# Patient Record
Sex: Male | Born: 1985 | Race: Black or African American | Hispanic: No | Marital: Married | Smoking: Never smoker
Health system: Southern US, Community
[De-identification: ages and names within clinical notes are randomized; demographics above are authoritative.]

## PROBLEM LIST (undated history)

## (undated) DIAGNOSIS — I10 Essential (primary) hypertension: Secondary | ICD-10-CM

## (undated) DIAGNOSIS — R569 Unspecified convulsions: Secondary | ICD-10-CM

## (undated) HISTORY — PX: NO PAST SURGERIES: SHX2092

---

## 2019-11-22 DIAGNOSIS — I480 Paroxysmal atrial fibrillation: Secondary | ICD-10-CM

## 2019-11-22 HISTORY — DX: Paroxysmal atrial fibrillation: I48.0

## 2021-06-27 ENCOUNTER — Other Ambulatory Visit: Payer: Self-pay

## 2021-06-27 ENCOUNTER — Ambulatory Visit (INDEPENDENT_AMBULATORY_CARE_PROVIDER_SITE_OTHER): Payer: BLUE CROSS/BLUE SHIELD

## 2021-06-27 ENCOUNTER — Encounter: Payer: Self-pay | Admitting: Emergency Medicine

## 2021-06-27 ENCOUNTER — Ambulatory Visit
Admission: EM | Admit: 2021-06-27 | Discharge: 2021-06-27 | Disposition: A | Discharge status: Home / Self Care | Payer: Self-pay

## 2021-06-27 DIAGNOSIS — M25571 Pain in right ankle and joints of right foot: Secondary | ICD-10-CM

## 2021-06-27 DIAGNOSIS — S86011A Strain of right Achilles tendon, initial encounter: Secondary | ICD-10-CM

## 2021-06-27 HISTORY — DX: Unspecified convulsions: R56.9

## 2021-06-27 NOTE — ED Triage Notes (Signed)
Patient states that while he was playing basketball yesterday he rolled his right ankle and felt a pop.  Patient c/o pain in his right ankle.

## 2021-06-27 NOTE — Discharge Instructions (Addendum)
Your x-ray is normal but my exam findings are concerning for Achilles tendon tear/rupture.  We have placed you in a splint.  You should wear this as a cast.  Don't get it wet. We have also given you crutches.  Do not bear weight on the affected extremity until orthopedics clears you.  I would suggest you follow-up with orthopedics in the next 1 to 2 days.  You will likely need to have MRI of this extremity and depending on findings you may need physical therapy versus surgery.  In the meantime you can take Tylenol and Advil as needed for pain rest and elevate the extremity.  You have a condition requiring you to follow up with Orthopedics so please call one of the following office for appointment:   [facility] [address] [place], [place] [address] Phone: [phone]  [facility] [address] [place], [place] [address] Phone: [phone]

## 2021-06-27 NOTE — ED Provider Notes (Signed)
[facility]    CSN: [id number] Arrival date & time: 06/27/21  1432      History   Chief Complaint Chief Complaint  Patient presents with   Ankle Pain    right    HPI Brady Phillips is a 35 y.o. male presenting for right ankle pain and swelling following an accident yesterday.  Patient states he was playing basketball and somehow rolled his foot.  He says that he felt a pop and had a sharp immediate pain.  Patient admits to difficulty with weightbearing.  He also has difficulty moving his right foot.  He denies any numbness or tingling.  He has iced the area and taking ibuprofen and Tylenol for discomfort.  Patient denies any pain at rest and only with weightbearing or touching his Achilles tendon.  No other injuries.  HPI  Past Medical History:  Diagnosis Date   Seizures (HCC)     There are no problems to display for this patient.   History reviewed. No pertinent surgical history.     Home Medications    Prior to Admission medications   Medication Sig Start Date End Date Taking? Authorizing Provider  valproic acid (DEPAKENE) 250 MG capsule Take by mouth. 12/21/17  Yes [provider]    Family History History reviewed. No pertinent family history.  Social History Social History   Tobacco Use   Smoking status: Never   Smokeless tobacco: Never  Vaping Use   Vaping Use: Never used  Substance Use Topics   Alcohol use: Yes   Drug use: Never     Allergies   Patient has no known allergies.   Review of Systems Review of Systems  Musculoskeletal:  Positive for arthralgias, gait problem and joint swelling.  Skin:  Negative for color change and wound.  Neurological:  Negative for weakness and numbness.    Physical Exam Triage Vital Signs ED Triage Vitals  Enc Vitals Group     BP 06/27/21 1534 (!) 139/91     Pulse Rate 06/27/21 1534 65     Resp 06/27/21 1534 16     Temp 06/27/21 1534 98.3 F (36.8 C)     Temp Source 06/27/21 1534 Oral      SpO2 06/27/21 1534 100 %     Weight 06/27/21 1530 252 lb (114.3 kg)     Height 06/27/21 1530 5\' 8"  (1.727 m)     Head Circumference --      Peak Flow --      Pain Score 06/27/21 1529 4     Pain Loc --      Pain Edu? --      Excl. in GC? --    No data found.  Updated Vital Signs BP (!) 139/91 (BP Location: Left Arm)   Pulse 65   Temp 98.3 F (36.8 C) (Oral)   Resp 16   Ht 5\' 8"  (1.727 m)   Wt 252 lb (114.3 kg)   SpO2 100%   BMI 38.32 kg/m      Physical Exam Vitals and nursing note reviewed.  Constitutional:      General: He is not in acute distress.    Appearance: Normal appearance. He is well-developed. He is not ill-appearing.  HENT:     Head: Normocephalic and atraumatic.  Eyes:     General: No scleral icterus.    Conjunctiva/sclera: Conjunctivae normal.  Cardiovascular:     Rate and Rhythm: Normal rate and regular rhythm.     Pulses:  Normal pulses.  Pulmonary:     Effort: Pulmonary effort is normal. No respiratory distress.  Abdominal:     Tenderness: There is no abdominal tenderness.  Musculoskeletal:     Cervical back: Neck supple.     Right ankle:     Right Achilles Tendon: Tenderness present. Thompson's test positive.     Comments: Inability to plantarflex right foot. Moderate swelling posterior ankle. No bony tenderness of ankle  Skin:    General: Skin is warm and dry.  Neurological:     General: No focal deficit present.     Mental Status: He is alert. Mental status is at baseline.     Gait: Gait abnormal.  Psychiatric:        Mood and Affect: Mood normal.        Thought Content: Thought content normal.     UC Treatments / Results  Labs (all labs ordered are listed, but only abnormal results are displayed) Labs Reviewed - No data to display  EKG   Radiology DG Ankle Complete Right  Result Date: 06/27/2021 CLINICAL DATA:  Right ankle pain EXAM: RIGHT ANKLE - COMPLETE 3+ VIEW COMPARISON:  None. FINDINGS: There is no evidence of  fracture, dislocation, or joint effusion. There is no evidence of arthropathy or other focal bone abnormality. Soft tissues are unremarkable. IMPRESSION: Negative. Electronically Signed   By: Helyn Numbers MD   On: 06/27/2021 15:50    Procedures Procedures (including critical care time)  Medications Ordered in UC Medications - No data to display  Initial Impression / Assessment and Plan / UC Course  I have reviewed the triage vital signs and the nursing notes.  Pertinent labs & imaging results that were available during my care of the patient were reviewed by me and considered in my medical decision making (see chart for details).  35 year old male presenting for right heel pain following a accidental injury yesterday.  X-ray normal.  On exam he does have positive Thompson test and inability to plantarflex.  Advised patient that his exam is concerning for an Achilles tendon tear/rupture.  Patient placed in short leg posterior splint with downward angulation.  Patient given crutches.  Patient advised to follow-up with orthopedics in the next 1 to 2 days.  He is a Loss adjuster, chartered.  Advised him that he should not be working until he is cleared by orthopedics.  He does not need to be driving since he cannot plantarflex.  Supportive care advised with following RICE guidelines.  ED precautions reviewed.  This is acute complicated injury likely requiring weeks of follow-up including MRI and likely surgery if he does have an Achilles tendon rupture/tear.   Final Clinical Impressions(s) / UC Diagnoses   Final diagnoses:  Achilles tendon rupture, right, initial encounter  Acute right ankle pain     Discharge Instructions      Your x-ray is normal but my exam findings are concerning for Achilles tendon tear/rupture.  We have placed you in a splint.  You should wear this as a cast.  Don't get it wet. We have also given you crutches.  Do not bear weight on the affected extremity until orthopedics  clears you.  I would suggest you follow-up with orthopedics in the next 1 to 2 days.  You will likely need to have MRI of this extremity and depending on findings you may need physical therapy versus surgery.  In the meantime you can take Tylenol and Advil as needed for pain rest and elevate  the extremity.  You have a condition requiring you to follow up with Orthopedics so please call one of the following office for appointment:   [facility] [address] [place], [place] [address] Phone: [phone]  [facility] [address], [place], [place] [address] Phone: [phone]      ED Prescriptions   None    PDMP not reviewed this encounter.   Shirlee Latch, PA-C 06/27/21 [phone]

## 2021-07-05 ENCOUNTER — Encounter
Admission: RE | Admit: 2021-07-05 | Discharge: 2021-07-05 | Disposition: A | Discharge status: Home / Self Care | Payer: BLUE CROSS/BLUE SHIELD | Source: Ambulatory Visit | Attending: Orthopedic Surgery | Admitting: Orthopedic Surgery

## 2021-07-05 ENCOUNTER — Other Ambulatory Visit: Payer: Self-pay

## 2021-07-05 ENCOUNTER — Other Ambulatory Visit: Payer: Self-pay | Admitting: Orthopedic Surgery

## 2021-07-05 HISTORY — DX: Essential (primary) hypertension: I10

## 2021-07-05 MED ORDER — CEFAZOLIN SODIUM-DEXTROSE 2-4 GM/100ML-% IV SOLN
2.0000 g | INTRAVENOUS | Status: AC
Start: 2021-07-06 — End: 2021-07-06
  Administered 2021-07-06: 2 g via INTRAVENOUS

## 2021-07-05 NOTE — Patient Instructions (Signed)
Your procedure is scheduled on: July 06, 2021 TUESDAY Report to the Registration Desk on the 1st floor of the Medical Mall. To find out your arrival time, please call [phone] between 1PM - 3PM on: July 05, 2021 Monday   REMEMBER: Instructions that are not followed completely may result in serious medical risk, up to and including death; or upon the discretion of your surgeon and anesthesiologist your surgery may need to be rescheduled.  DO NOT EAT OR DRINK after midnight the night before surgery.  No gum chewing, lozengers or hard candies.  TAKE THESE MEDICATIONS THE MORNING OF SURGERY WITH A SIP OF WATER: DEPAKENE  One week prior to surgery: Stop Anti-inflammatories (NSAIDS) such as Advil, Aleve, Ibuprofen, Motrin, Naproxen, Naprosyn and ASPIRIN OR Aspirin based products such as Excedrin, Goodys Powder, BC Powder. Stop ANY OVER THE COUNTER supplements until after surgery. You may however, continue to take Tylenol if needed for pain up until the day of surgery.  No Alcohol for 24 hours before or after surgery.  No Smoking including e-cigarettes for 24 hours prior to surgery.  No chewable tobacco products for at least 6 hours prior to surgery.  No nicotine patches on the day of surgery.  Do not use any "recreational" drugs for at least a week prior to your surgery.  Please be advised that the combination of cocaine and anesthesia may have negative outcomes, up to and including death. If you test positive for cocaine, your surgery will be cancelled.  On the morning of surgery brush your teeth with toothpaste and water, you may rinse your mouth with mouthwash if you wish. Do not swallow any toothpaste or mouthwash.  Do not wear jewelry, make-up, hairpins, clips or nail polish.  Do not wear lotions, powders, or perfumes OR DEODORANT   Do not shave body from the neck down 48 hours prior to surgery just in case you cut yourself which could leave a site for infection.  Also,  freshly shaved skin may become irritated if using the CHG soap.  Contact lenses, hearing aids and dentures may not be worn into surgery.  Do not bring valuables to the hospital. [facility] is not responsible for any missing/lost belongings or valuables.   SHOWER MORNING OF SURGERY  Notify your doctor if there is any change in your medical condition (cold, fever, infection).  Wear comfortable clothing (specific to your surgery type) to the hospital.  After surgery, you can help prevent lung complications by doing breathing exercises.  Take deep breaths and cough every 1-2 hours. Your doctor may order a device called an Incentive Spirometer to help you take deep breaths. When coughing or sneezing, hold a pillow firmly against your incision with both hands. This is called "splinting." Doing this helps protect your incision. It also decreases belly discomfort.  If you are being discharged the day of surgery, you will not be allowed to drive home. You will need a responsible adult (18 years or older) to drive you home and stay with you that night.   If you are taking public transportation, you will need to have a responsible adult (18 years or older) with you. Please confirm with your physician that it is acceptable to use public transportation.   Please call the Pre-admissions Testing Dept. at [phone] if you have any questions about these instructions.  Surgery Visitation Policy:  Patients undergoing a surgery or procedure may have one family member or support person with them as long as that  person is not COVID-19 positive or experiencing its symptoms.  That person may remain in the waiting area during the procedure.  Inpatient Visitation:    Visiting hours are 7 a.m. to 8 p.m. Inpatients will be allowed two visitors daily. The visitors may change each day during the patient's stay. No visitors under the age of 78. Any visitor under the age of 19 must be accompanied by an  adult. The visitor must pass COVID-19 screenings, use hand sanitizer when entering and exiting the patient's room and wear a mask at all times, including in the patient's room. Patients must also wear a mask when staff or their visitor are in the room. Masking is required regardless of vaccination status.

## 2021-07-06 ENCOUNTER — Ambulatory Visit: Payer: BLUE CROSS/BLUE SHIELD | Admitting: Certified Registered"

## 2021-07-06 ENCOUNTER — Ambulatory Visit
Admission: RE | Admit: 2021-07-06 | Discharge: 2021-07-06 | Disposition: A | Discharge status: Home / Self Care | Payer: BLUE CROSS/BLUE SHIELD | Attending: Orthopedic Surgery | Admitting: Orthopedic Surgery

## 2021-07-06 ENCOUNTER — Encounter: Payer: Self-pay | Admitting: Orthopedic Surgery

## 2021-07-06 ENCOUNTER — Ambulatory Visit: Payer: BLUE CROSS/BLUE SHIELD

## 2021-07-06 ENCOUNTER — Other Ambulatory Visit: Payer: Self-pay

## 2021-07-06 ENCOUNTER — Encounter
Admission: RE | Disposition: A | Discharge status: Home / Self Care | Payer: Self-pay | Source: Home / Self Care | Attending: Orthopedic Surgery

## 2021-07-06 DIAGNOSIS — X501XXA Overexertion from prolonged static or awkward postures, initial encounter: Secondary | ICD-10-CM | POA: Insufficient documentation

## 2021-07-06 DIAGNOSIS — Z79899 Other long term (current) drug therapy: Secondary | ICD-10-CM | POA: Insufficient documentation

## 2021-07-06 DIAGNOSIS — Z7982 Long term (current) use of aspirin: Secondary | ICD-10-CM | POA: Insufficient documentation

## 2021-07-06 DIAGNOSIS — Z419 Encounter for procedure for purposes other than remedying health state, unspecified: Secondary | ICD-10-CM

## 2021-07-06 DIAGNOSIS — Y9367 Activity, basketball: Secondary | ICD-10-CM | POA: Diagnosis not present

## 2021-07-06 DIAGNOSIS — S86011A Strain of right Achilles tendon, initial encounter: Secondary | ICD-10-CM | POA: Diagnosis not present

## 2021-07-06 HISTORY — PX: ACHILLES TENDON SURGERY: SHX542

## 2021-07-06 SURGERY — REPAIR, TENDON, ACHILLES
Anesthesia: General | Site: Ankle | Laterality: Right

## 2021-07-06 MED ORDER — ONDANSETRON HCL 4 MG/2ML IJ SOLN
INTRAMUSCULAR | Status: AC
  Administered 2021-07-06: 4 mg via INTRAVENOUS
  Filled 2021-07-06: qty 2

## 2021-07-06 MED ORDER — BUPIVACAINE HCL (PF) 0.5 % IJ SOLN
INTRAMUSCULAR | Status: AC
  Filled 2021-07-06: qty 30

## 2021-07-06 MED ORDER — FAMOTIDINE 20 MG PO TABS
ORAL_TABLET | ORAL | Status: AC
  Filled 2021-07-06: qty 1

## 2021-07-06 MED ORDER — DEXMEDETOMIDINE (PRECEDEX) IN NS 20 MCG/5ML (4 MCG/ML) IV SYRINGE
PREFILLED_SYRINGE | INTRAVENOUS | Status: DC | PRN
  Administered 2021-07-06: 20 ug via INTRAVENOUS

## 2021-07-06 MED ORDER — MIDAZOLAM HCL 2 MG/2ML IJ SOLN
INTRAMUSCULAR | Status: AC
  Filled 2021-07-06: qty 2

## 2021-07-06 MED ORDER — ACETAMINOPHEN 10 MG/ML IV SOLN
INTRAVENOUS | Status: DC | PRN
  Administered 2021-07-06: 1000 mg via INTRAVENOUS

## 2021-07-06 MED ORDER — DEXAMETHASONE SODIUM PHOSPHATE 10 MG/ML IJ SOLN
INTRAMUSCULAR | Status: DC | PRN
  Administered 2021-07-06: 4 mg
  Administered 2021-07-06: 10 mg

## 2021-07-06 MED ORDER — LIDOCAINE HCL (CARDIAC) PF 100 MG/5ML IV SOSY
PREFILLED_SYRINGE | INTRAVENOUS | Status: DC | PRN
  Administered 2021-07-06: 50 mg via INTRAVENOUS

## 2021-07-06 MED ORDER — BUPIVACAINE HCL (PF) 0.5 % IJ SOLN
INTRAMUSCULAR | Status: AC
  Filled 2021-07-06: qty 20

## 2021-07-06 MED ORDER — PROPOFOL 10 MG/ML IV BOLUS
INTRAVENOUS | Status: DC | PRN
  Administered 2021-07-06: 200 mg via INTRAVENOUS

## 2021-07-06 MED ORDER — ACETAMINOPHEN 10 MG/ML IV SOLN
INTRAVENOUS | Status: AC
  Filled 2021-07-06: qty 100

## 2021-07-06 MED ORDER — ONDANSETRON HCL 4 MG/2ML IJ SOLN
INTRAMUSCULAR | Status: DC | PRN
  Administered 2021-07-06: 4 mg via INTRAVENOUS

## 2021-07-06 MED ORDER — CHLORHEXIDINE GLUCONATE CLOTH 2 % EX PADS: 6.0000 | MEDICATED_PAD | Freq: Once | CUTANEOUS | Status: DC

## 2021-07-06 MED ORDER — SUGAMMADEX SODIUM 500 MG/5ML IV SOLN
INTRAVENOUS | Status: AC
  Filled 2021-07-06: qty 10

## 2021-07-06 MED ORDER — ROCURONIUM BROMIDE 100 MG/10ML IV SOLN
INTRAVENOUS | Status: DC | PRN
  Administered 2021-07-06: 60 mg via INTRAVENOUS

## 2021-07-06 MED ORDER — FENTANYL CITRATE (PF) 100 MCG/2ML IJ SOLN: 100.0000 ug | Freq: Once | INTRAMUSCULAR | Status: AC

## 2021-07-06 MED ORDER — ASPIRIN EC 325 MG PO TBEC: 325.0000 mg | DELAYED_RELEASE_TABLET | Freq: Two times a day (BID) | ORAL | 0 refills | Status: AC

## 2021-07-06 MED ORDER — KETAMINE HCL 10 MG/ML IJ SOLN
INTRAMUSCULAR | Status: DC | PRN
  Administered 2021-07-06: 20 mg via INTRAVENOUS
  Administered 2021-07-06: 30 mg via INTRAVENOUS

## 2021-07-06 MED ORDER — ONDANSETRON HCL 4 MG PO TABS: 4.0000 mg | ORAL_TABLET | Freq: Three times a day (TID) | ORAL | 0 refills | Status: AC | PRN

## 2021-07-06 MED ORDER — CHLORHEXIDINE GLUCONATE 0.12 % MT SOLN
OROMUCOSAL | Status: AC
  Administered 2021-07-06: 15 mL via OROMUCOSAL
  Filled 2021-07-06: qty 15

## 2021-07-06 MED ORDER — OXYCODONE HCL 5 MG/5ML PO SOLN: 5.0000 mg | Freq: Once | ORAL | Status: DC | PRN

## 2021-07-06 MED ORDER — ONDANSETRON HCL 4 MG/2ML IJ SOLN: 4.0000 mg | Freq: Once | INTRAMUSCULAR | Status: AC | PRN

## 2021-07-06 MED ORDER — NEOMYCIN-POLYMYXIN B GU 40-200000 IR SOLN
Status: DC | PRN
  Administered 2021-07-06: 2 mL

## 2021-07-06 MED ORDER — FENTANYL CITRATE (PF) 100 MCG/2ML IJ SOLN
INTRAMUSCULAR | Status: AC
  Filled 2021-07-06: qty 2

## 2021-07-06 MED ORDER — ORAL CARE MOUTH RINSE: 15.0000 mL | Freq: Once | OROMUCOSAL | Status: AC

## 2021-07-06 MED ORDER — PHENYLEPHRINE HCL (PRESSORS) 10 MG/ML IV SOLN
INTRAVENOUS | Status: DC | PRN
  Administered 2021-07-06 (×3): 100 ug via INTRAVENOUS

## 2021-07-06 MED ORDER — DEXAMETHASONE SODIUM PHOSPHATE 10 MG/ML IJ SOLN
INTRAMUSCULAR | Status: AC
  Filled 2021-07-06: qty 1

## 2021-07-06 MED ORDER — FENTANYL CITRATE (PF) 100 MCG/2ML IJ SOLN
INTRAMUSCULAR | Status: DC | PRN
  Administered 2021-07-06: 100 ug via INTRAVENOUS
  Administered 2021-07-06: 50 ug via INTRAVENOUS

## 2021-07-06 MED ORDER — ACETAMINOPHEN 10 MG/ML IV SOLN: 1000.0000 mg | Freq: Once | INTRAVENOUS | Status: DC | PRN

## 2021-07-06 MED ORDER — OXYCODONE HCL 5 MG PO TABS: 5.0000 mg | ORAL_TABLET | Freq: Once | ORAL | Status: DC | PRN

## 2021-07-06 MED ORDER — PROPOFOL 10 MG/ML IV BOLUS
INTRAVENOUS | Status: AC
  Filled 2021-07-06: qty 20

## 2021-07-06 MED ORDER — KETAMINE HCL 50 MG/5ML IJ SOSY
PREFILLED_SYRINGE | INTRAMUSCULAR | Status: AC
  Filled 2021-07-06: qty 5

## 2021-07-06 MED ORDER — FENTANYL CITRATE (PF) 100 MCG/2ML IJ SOLN: 25.0000 ug | INTRAMUSCULAR | Status: DC | PRN

## 2021-07-06 MED ORDER — FENTANYL CITRATE (PF) 100 MCG/2ML IJ SOLN
INTRAMUSCULAR | Status: AC
  Administered 2021-07-06: 50 ug via INTRAVENOUS
  Filled 2021-07-06: qty 2

## 2021-07-06 MED ORDER — MIDAZOLAM HCL 2 MG/2ML IJ SOLN
INTRAMUSCULAR | Status: AC
  Administered 2021-07-06: 1 mg via INTRAVENOUS
  Filled 2021-07-06: qty 2

## 2021-07-06 MED ORDER — SODIUM CHLORIDE (PF) 0.9 % IJ SOLN
INTRAMUSCULAR | Status: AC
  Filled 2021-07-06: qty 20

## 2021-07-06 MED ORDER — MIDAZOLAM HCL 2 MG/2ML IJ SOLN
INTRAMUSCULAR | Status: DC | PRN
  Administered 2021-07-06: 2 mg via INTRAVENOUS

## 2021-07-06 MED ORDER — GLYCOPYRROLATE 0.2 MG/ML IJ SOLN
INTRAMUSCULAR | Status: DC | PRN
Start: 2021-07-06 — End: 2021-07-06
  Administered 2021-07-06: .2 mg via INTRAVENOUS

## 2021-07-06 MED ORDER — 0.9 % SODIUM CHLORIDE (POUR BTL) OPTIME
TOPICAL | Status: DC | PRN
  Administered 2021-07-06: 500 mL

## 2021-07-06 MED ORDER — LACTATED RINGERS IV SOLN: INTRAVENOUS | Status: DC

## 2021-07-06 MED ORDER — SUGAMMADEX SODIUM 200 MG/2ML IV SOLN
INTRAVENOUS | Status: DC | PRN
  Administered 2021-07-06: 240 mg via INTRAVENOUS

## 2021-07-06 MED ORDER — MIDAZOLAM HCL 2 MG/2ML IJ SOLN: 2.0000 mg | Freq: Once | INTRAMUSCULAR | Status: AC

## 2021-07-06 MED ORDER — OXYCODONE HCL 5 MG PO TABS
5.0000 mg | ORAL_TABLET | ORAL | 0 refills | Status: AC | PRN
Start: 2021-07-06 — End: ?

## 2021-07-06 MED ORDER — CEFAZOLIN SODIUM-DEXTROSE 2-4 GM/100ML-% IV SOLN
INTRAVENOUS | Status: AC
  Filled 2021-07-06: qty 100

## 2021-07-06 MED ORDER — CHLORHEXIDINE GLUCONATE 0.12 % MT SOLN: 15.0000 mL | Freq: Once | OROMUCOSAL | Status: AC

## 2021-07-06 MED ORDER — BUPIVACAINE HCL (PF) 0.25 % IJ SOLN
INTRAMUSCULAR | Status: DC | PRN
  Administered 2021-07-06: 30 mL

## 2021-07-06 MED ORDER — FAMOTIDINE 20 MG PO TABS: 20.0000 mg | ORAL_TABLET | Freq: Once | ORAL | Status: DC

## 2021-07-06 MED ORDER — NEOMYCIN-POLYMYXIN B GU 40-200000 IR SOLN
Status: AC
  Filled 2021-07-06: qty 2

## 2021-07-06 SURGICAL SUPPLY — 58 items
BLADE MED AGGRESSIVE (BLADE) ×2 IMPLANT
BLADE SURG 15 STRL LF DISP TIS (BLADE) ×2 IMPLANT
BLADE SURG 15 STRL SS (BLADE) ×4
BNDG CMPR STD VLCR NS LF 5.8X4 (GAUZE/BANDAGES/DRESSINGS) ×1
BNDG CMPR STD VLCR NS LF 5.8X6 (GAUZE/BANDAGES/DRESSINGS) ×1
BNDG COHESIVE 4X5 TAN ST LF (GAUZE/BANDAGES/DRESSINGS) ×2 IMPLANT
BNDG ELASTIC 4X5.8 VLCR NS LF (GAUZE/BANDAGES/DRESSINGS) ×2 IMPLANT
BNDG ELASTIC 6X5.8 VLCR NS LF (GAUZE/BANDAGES/DRESSINGS) ×2 IMPLANT
BNDG ESMARK 6X12 TAN STRL LF (GAUZE/BANDAGES/DRESSINGS) ×2 IMPLANT
CANISTER SUCT 1200ML W/VALVE (MISCELLANEOUS) ×2 IMPLANT
CUFF TOURN SGL QUICK 24 (TOURNIQUET CUFF)
CUFF TOURN SGL QUICK 34 (TOURNIQUET CUFF)
CUFF TRNQT CYL 24X4X16.5-23 (TOURNIQUET CUFF) IMPLANT
CUFF TRNQT CYL 34X4.125X (TOURNIQUET CUFF) IMPLANT
DRAPE FLUOR MINI C-ARM 54X84 (DRAPES) IMPLANT
DRAPE INCISE IOBAN 66X45 STRL (DRAPES) ×2 IMPLANT
DRAPE U-SHAPE 47X51 STRL (DRAPES) ×2 IMPLANT
DURAPREP 26ML APPLICATOR (WOUND CARE) ×6 IMPLANT
ELECT REM PT RETURN 9FT ADLT (ELECTROSURGICAL) ×2
ELECTRODE REM PT RTRN 9FT ADLT (ELECTROSURGICAL) ×1 IMPLANT
FIBERLOOP FIBERTAG STR NDL (MISCELLANEOUS) ×4 IMPLANT
FIBERSTICK 2 (SUTURE) IMPLANT
GAUZE 4X4 16PLY ~~LOC~~+RFID DBL (SPONGE) ×2 IMPLANT
GAUZE SPONGE 4X4 12PLY STRL (GAUZE/BANDAGES/DRESSINGS) ×2 IMPLANT
GAUZE XEROFORM 1X8 LF (GAUZE/BANDAGES/DRESSINGS) ×2 IMPLANT
GLOVE SURG ORTHO LTX SZ9 (GLOVE) ×6 IMPLANT
GLOVE SURG UNDER POLY LF SZ9 (GLOVE) ×2 IMPLANT
GOWN STRL REUS TWL 2XL XL LVL4 (GOWN DISPOSABLE) ×2 IMPLANT
GOWN STRL REUS W/ TWL LRG LVL3 (GOWN DISPOSABLE) ×1 IMPLANT
GOWN STRL REUS W/TWL LRG LVL3 (GOWN DISPOSABLE) ×2
GOWN STRL REUS W/TWL LRG LVL4 (GOWN DISPOSABLE) ×2 IMPLANT
KIT TURNOVER KIT A (KITS) ×2 IMPLANT
MANIFOLD NEPTUNE II (INSTRUMENTS) ×2 IMPLANT
NEEDLE MAYO CATGUT SZ4 (NEEDLE) ×2 IMPLANT
NS IRRIG 1000ML POUR BTL (IV SOLUTION) IMPLANT
NS IRRIG 500ML POUR BTL (IV SOLUTION) ×2 IMPLANT
PACK EXTREMITY ARMC (MISCELLANEOUS) ×2 IMPLANT
PAD ABD DERMACEA PRESS 5X9 (GAUZE/BANDAGES/DRESSINGS) ×12 IMPLANT
PAD CAST CTTN 4X4 STRL (SOFTGOODS) ×3 IMPLANT
PADDING CAST BLEND 6X4 STRL (MISCELLANEOUS) ×4 IMPLANT
PADDING CAST COTTON 4X4 STRL (SOFTGOODS) ×6
PADDING STRL CAST 6IN (MISCELLANEOUS) ×4
SPLINT CAST 1 STEP 4X30 (MISCELLANEOUS) ×4 IMPLANT
SPONGE T-LAP 18X18 ~~LOC~~+RFID (SPONGE) ×2 IMPLANT
STAPLER SKIN PROX 35W (STAPLE) ×2 IMPLANT
STOCKINETTE M/LG 89821 (MISCELLANEOUS) ×2 IMPLANT
SUT FIBERWIRE #2 38 BLUE 1/2 (SUTURE) ×2
SUT FIBERWIRE #5 38 BLUE (WIRE) ×4 IMPLANT
SUT FIBERWIRE #5 38 CONV BLUE (SUTURE) ×2
SUT ORTHOCORD OS-6 NDL 36 (SUTURE) IMPLANT
SUT ULTRABRAID #2 38 (SUTURE) IMPLANT
SUT VIC AB 0 CT1 36 (SUTURE) ×2 IMPLANT
SUT VIC AB 2-0 CT1 27 (SUTURE) ×6
SUT VIC AB 2-0 CT1 TAPERPNT 27 (SUTURE) ×3 IMPLANT
SUT VIC AB 4-0 SH 27 (SUTURE) ×6
SUT VIC AB 4-0 SH 27XANBCTRL (SUTURE) ×3 IMPLANT
SUTURE FIBERWR #2 38 BLUE 1/2 (SUTURE) ×1 IMPLANT
SUTURE FIBERWR #5 38 CONV BLUE (SUTURE) ×1 IMPLANT

## 2021-07-06 NOTE — Anesthesia Preprocedure Evaluation (Signed)
Anesthesia Evaluation  Patient identified by MRN, date of birth, ID band Patient awake    Reviewed: Allergy & Precautions, NPO status , Patient's Chart, lab work & pertinent test results  History of Anesthesia Complications Negative for: history of anesthetic complications  Airway Mallampati: III  TM Distance: >3 FB Neck ROM: Full    Dental no notable dental hx. (+) Teeth Intact   Pulmonary neg pulmonary ROS, neg sleep apnea, neg COPD, Patient abstained from smoking.Not current smoker,    Pulmonary exam normal breath sounds clear to auscultation       Cardiovascular Exercise Tolerance: Good METShypertension, (-) CAD and (-) Past MI (-) dysrhythmias  Rhythm:Regular Rate:Normal - Systolic murmurs  Per patient he had an episode of a-fib last year, but sounds like it self resolved, with no issues since.   Neuro/Psych Seizures -, Well Controlled,  Well controlled seizures, none since 2000s. negative psych ROS   GI/Hepatic neg GERD  ,(+)     (-) substance abuse  ,   Endo/Other  neg diabetes  Renal/GU negative Renal ROS     Musculoskeletal   Abdominal   Peds  Hematology   Anesthesia Other Findings Past Medical History: No date: Hypertension     Comment:  IN THE PAST 11/2019: Paroxysmal A-fib (HCC) No date: Seizures (HCC)  Reproductive/Obstetrics                            Anesthesia Physical Anesthesia Plan  ASA: 2  Anesthesia Plan: General   Post-op Pain Management:  Regional for Post-op pain   Induction: Intravenous  PONV Risk Score and Plan: 2 and Ondansetron, Dexamethasone and Midazolam  Airway Management Planned: Oral ETT  Additional Equipment: None  Intra-op Plan:   Post-operative Plan: Extubation in OR  Informed Consent: I have reviewed the patients History and Physical, chart, labs and discussed the procedure including the risks, benefits and alternatives for the  proposed anesthesia with the patient or authorized representative who has indicated his/her understanding and acceptance.     Dental advisory given  Plan Discussed with: CRNA and Surgeon  Anesthesia Plan Comments: (Discussed risks of anesthesia with patient, including PONV, sore throat, lip/dental damage. Rare risks discussed as well, such as cardiorespiratory and neurological sequelae, and allergic reactions. Patient understands.   Discussed r/b/a of popliteal nerve block, including:  - bleeding, infection, nerve damage - poor or non functioning block. Patient understands.  )        Anesthesia Quick Evaluation

## 2021-07-06 NOTE — Anesthesia Procedure Notes (Signed)
Anesthesia Regional Block: Popliteal block   Pre-Anesthetic Checklist: , timeout performed,  Correct Patient, Correct Site, Correct Laterality,  Correct Procedure, Correct Position, site marked,  Risks and benefits discussed,  Surgical consent,  Pre-op evaluation,  At surgeon's request and post-op pain management  Laterality: Lower and Right  Prep: chloraprep       Needles:  Injection technique: Single-shot  Needle Type: Echogenic Needle     Needle Length: 9cm  Needle Gauge: 21     Additional Needles:   Procedures:,,,, ultrasound used (permanent image in chart),,    Narrative:  Start time: 07/06/2021 1:00 PM End time: 07/06/2021 1:05 PM Injection made incrementally with aspirations every 5 mL.  Performed by: Personally  Anesthesiologist: Corinda Gubler, MD  Additional Notes: Patient's chart reviewed and they were deemed appropriate candidate for procedure, at surgeon's request. Patient educated about risks, benefits, and alternatives of the block including but not limited to: temporary or permanent nerve damage, bleeding, infection, damage to surround tissues, block failure, local anesthetic toxicity. Patient expressed understanding. A formal time-out was conducted consistent with institution rules.  Monitors were applied, and minimal sedation used. The site was prepped with skin prep and allowed to dry, and sterile gloves were used. A high frequency linear ultrasound probe with probe cover was utilized throughout. Popliteal artery pulsatile and visualized in popliteal fossa along with adjacent sciatic nerve and its branch point, which appeared anatomically normal, local anesthetic injected around them just proximal to the branch point, and echogenic block needle trajectory was monitored throughout. Aspiration performed every 62ml. Blood vessels were avoided. All injections were performed without resistance and free of blood and paresthesias. The patient tolerated the procedure  well.  Injectate: 65ml 0.25% bupivacaine + 4mg  decadron

## 2021-07-06 NOTE — Discharge Instructions (Signed)

## 2021-07-06 NOTE — Op Note (Signed)
07/06/2021  3:45 PM  PATIENT:  Brady Phillips    PRE-OPERATIVE DIAGNOSIS:  Right Achilles Tendon Rupture  POST-OPERATIVE DIAGNOSIS:  Same  PROCEDURE:  RIGHT OPEN ACHILLES TENDON REPAIR  SURGEON:  Thornton Park, MD  ANESTHESIA:   General  PREOPERATIVE INDICATIONS:  Brady Phillips is a  35 y.o. male with a diagnosis of Right Achilles Tendon Rupture who failed conservative measures and elected for surgical management.    I discussed the risks and benefits of surgery. The risks include but are not limited to infection, bleeding requiring blood transfusion, nerve or blood vessel injury, joint stiffness or loss of motion, persistent pain, weakness or instability, malunion, nonunion and hardware failure and the need for further surgery. Medical risks include but are not limited to DVT and pulmonary embolism, myocardial infarction, stroke, pneumonia, respiratory failure and death. Patient understood these risks and wished to proceed.    OPERATIVE IMPLANTS: Arthrex #5 FiberWire x2, Arthrex #2 FiberWirex 1  OPERATIVE FINDINGS: Complete rupture of the right Achilles tendon approximately 3 to 4 cm above the calcaneal insertion  OPERATIVE PROCEDURE: Patient was met in the preoperative area.  Preop history and physical was performed.  The right leg was marked according to the hospital's correct site of surgery protocol.  He was brought to the operating room.  He is placed in a prone position.  Patient was carefully padded.  A tourniquet was applied to the right lower thigh.  He was prepped and draped in a sterile fashion.    A timeout was performed to verify the patient's name, date of birth and medical record number, correct site of surgery and correct procedure to be performed.  Once all i in attendance were in agreement, the case began.  Patient received 2 g of Ancef prior to the onset of the case.  The tourniquet was inflated to 247mHg for 73 minutes.  A vertical incision was made along the medial  border of the Achilles tendon.  Full-thickness skin flaps were developed.  The peritenon was then incised with a #15 blade.  The Achilles tendon tear was identified and freed from surrounding adhesions.  Two Arthrex #5 FiberWires were then used to secure both ends of the Achilles tendon tear using a Krakw suture.  The ankle was then plantarflexed.  The 2 ends of the Achilles tendon were brought into approximation.  The 2 medial eminence of the #5 FiberWires were then securely tied down.  Then the lateral 2 suture ends were tied down.  The ankle was then dorsiflex and plantarflex and there is no gapping at the repair site.  A #2 FiberWire was then used to oversew the Achilles tendon repair site.  The wound was then copiously irrigated.  The peritenon was repaired using interrupted 2-0 Vicryl.  The subcutaneous tissue was also closed with 2-0 Vicryl.  The skin was approximated staples.  A dry sterile dressing was applied.  They will splint was then fabricated to secure patient in plantarflexion.  Patient was then transferred onto the hospital stretcher in a supine position where he was extubated and brought to the PACU in stable condition.  I scrubbed and present for the entire case and all sharp, sponge and instrument counts were correct at the conclusion the case.  I spoke with the patient's wife postoperatively to let her know the case it was performed without complication the patient was stable in the recovery room.  Patient will remain nonweightbearing on the right lower extremity postop until he follows up in  our office.

## 2021-07-06 NOTE — Transfer of Care (Signed)
Immediate Anesthesia Transfer of Care Note  Patient: Brady Phillips  Procedure(s) Performed: RIGHT ACHILLES TENDON REPAIR (Right: Ankle)  Patient Location: PACU  Anesthesia Type:General  Level of Consciousness: awake and alert   Airway & Oxygen Therapy: Patient Spontanous Breathing and Patient connected to face mask oxygen  Post-op Assessment: Report given to RN and Post -op Vital signs reviewed and stable  Post vital signs: Reviewed and stable  Last Vitals:  Vitals Value Taken Time  BP 109/56 07/06/21 1550  Temp    Pulse 67 07/06/21 1555  Resp 12 07/06/21 1555  SpO2 99 % 07/06/21 1555  Vitals shown include unvalidated device data.  Last Pain:  Vitals:   07/06/21 1212  TempSrc: Oral  PainSc: 0-No pain         Complications: No notable events documented.

## 2021-07-06 NOTE — Anesthesia Postprocedure Evaluation (Signed)
Anesthesia Post Note  Patient: Brady Phillips  Procedure(s) Performed: RIGHT ACHILLES TENDON REPAIR (Right: Ankle)  Patient location during evaluation: PACU Anesthesia Type: General Level of consciousness: awake and alert Pain management: pain level controlled Vital Signs Assessment: post-procedure vital signs reviewed and stable Respiratory status: spontaneous breathing, nonlabored ventilation, respiratory function stable and patient connected to nasal cannula oxygen Cardiovascular status: blood pressure returned to baseline and stable Postop Assessment: no apparent nausea or vomiting Anesthetic complications: no   No notable events documented.   Last Vitals:  Vitals:   07/06/21 1715 07/06/21 1732  BP: (!) 101/58 (!) 107/58  Pulse: 67 (!) 58  Resp: 20 16  Temp:  (!) 35.8 C  SpO2: 97% 95%    Last Pain:  Vitals:   07/06/21 1732  TempSrc: Temporal  PainSc: 0-No pain                 Cleda Mccreedy Raevon Broom

## 2021-07-06 NOTE — Anesthesia Procedure Notes (Signed)
Procedure Name: Intubation Date/Time: 07/06/2021 1:56 PM Performed by: Cheral Bay, CRNA Pre-anesthesia Checklist: Patient identified, Emergency Drugs available, Suction available and Patient being monitored Patient Re-evaluated:Patient Re-evaluated prior to induction Oxygen Delivery Method: Circle system utilized Preoxygenation: Pre-oxygenation with 100% oxygen Induction Type: IV induction Ventilation: Mask ventilation without difficulty Laryngoscope Size: McGraph and 4 Grade View: Grade I Tube type: Oral Number of attempts: 1 Airway Equipment and Method: Stylet Placement Confirmation: ETT inserted through vocal cords under direct vision, positive ETCO2 and breath sounds checked- equal and bilateral Secured at: 22 cm Tube secured with: Tape Dental Injury: Teeth and Oropharynx as per pre-operative assessment

## 2021-07-07 ENCOUNTER — Encounter: Payer: Self-pay | Admitting: Orthopedic Surgery

## 2021-07-26 NOTE — H&P (Signed)
PREOPERATIVE H&P  Chief Complaint: Right Achilles Tendon Rupture  HPI: Brady Phillips is a 35 y.o. male who presents for preoperative history and physical with a diagnosis of Right Achilles Tendon Rupture confirmed by MRI.  The injury occurred while playing basketball.   He rolled his ankle a little bit but felt a pop in the back of his right foot.  Patient has weakness to plantarflexion of his right ankle.  Patient elected to proceed with surgical fixation of his Achilles tendon rupture.  Past Medical History:  Diagnosis Date   Hypertension    IN THE PAST   Paroxysmal A-fib ([place]) 11/2019   Seizures ([place])    Past Surgical History:  Procedure Laterality Date   ACHILLES TENDON SURGERY Right 07/06/2021   Procedure: RIGHT ACHILLES TENDON REPAIR;  Surgeon: Thornton Park, MD;  Location: [facility] ORS;  Service: Orthopedics;  Laterality: Right;   NO PAST SURGERIES     Social History   Socioeconomic History   Marital status: Married    Spouse name: Not on file   Number of children: Not on file   Years of education: Not on file   Highest education level: Not on file  Occupational History   Not on file  Tobacco Use   Smoking status: Never   Smokeless tobacco: Never  Vaping Use   Vaping Use: Never used  Substance and Sexual Activity   Alcohol use: Yes    Comment: OCCAS   Drug use: Never   Sexual activity: Not on file  Other Topics Concern   Not on file  Social History Narrative   Not on file   Social Determinants of Health   Financial Resource Strain: Not on file  Food Insecurity: Not on file  Transportation Needs: Not on file  Physical Activity: Not on file  Stress: Not on file  Social Connections: Not on file   History reviewed. No pertinent family history. No Known Allergies Prior to Admission medications   Medication Sig Start Date End Date Taking? Authorizing Provider  aspirin EC 325 MG tablet Take 1 tablet (325 mg total) by mouth 2 (two) times daily. 07/06/21  Yes  Thornton Park, MD  ondansetron (ZOFRAN) 4 MG tablet Take 1 tablet (4 mg total) by mouth every 8 (eight) hours as needed for nausea or vomiting. 07/06/21  Yes Thornton Park, MD  oxyCODONE (OXY IR/ROXICODONE) 5 MG immediate release tablet Take 1 tablet (5 mg total) by mouth every 4 (four) hours as needed. 07/06/21  Yes Thornton Park, MD  valproic acid (DEPAKENE) 250 MG capsule Take 250 mg by mouth 3 (three) times daily. 12/21/17  Yes [provider]     Positive ROS: All other systems have been reviewed and were otherwise negative with the exception of those mentioned in the HPI and as above.  Physical Exam: General: Alert, no acute distress Cardiovascular: Regular rate and rhythm, no murmurs rubs or gallops.  No pedal edema Respiratory: Clear to auscultation bilaterally, no wheezes rales or rhonchi. No cyanosis, no use of accessory musculature GI: No organomegaly, abdomen is soft and non-tender nondistended with positive bowel sounds. Skin: Skin intact, no lesions within the operative field. Neurologic: Sensation intact distally Psychiatric: Patient is competent for consent with normal mood and affect Lymphatic: No cervical lymphadenopathy  MUSCULOSKELETAL: Right Ankle: The patient has mild diffuse swelling around the right ankle. There is no ecchymosis or erythema. He has palpable pedal pulses, intact sensation to light touch and intact motor function distally, but demonstrates inability to  generate any force with plantarflexion. He had no plantarflexion with [place] Silos test. He had a palpable defect within the mid substance of the Achilles without significant diastasis. He has no calf tenderness. He has intact sensation to light touch throughout the right lower extremity. He can flex and extend his toes and dorsiflex actively his ankle with good strength.   Radiology: I reviewed the MRI images as well as radiology report. This shows a completely ruptured Achilles tendon within the  intrasubstance. The patient was noted to have a low lying soleus musculotendinous junction. The patient was noted to have a longitudinal split tear of the peroneus brevis tendon with soft tissue swelling.   Assessment: Right Achilles Tendon Rupture  Plan: Plan for Procedure(s): RIGHT OPEN ACHILLES TENDON REPAIR  Patient was met in the preoperative area today.  I marked the right leg according to the hospital's correct site of surgery protocol.  A preop history and physical was performed at the bedside.  I reviewed his MRI personally in preparation for this case.  I discussed the risks and benefits of surgery. The risks include but are not limited to infection, bleeding, nerve or blood vessel injury particularly injury to the sural nerve, joint stiffness or loss of motion, persistent pain, weakness or instability, wound healing problems, rerupture of the Achilles tendon with failure of the repair and the need for further surgery. Medical risks include but are not limited to DVT and pulmonary embolism, myocardial infarction, stroke, pneumonia, respiratory failure and death. Patient understood these risks and wished to proceed.     Thornton Park, MD   07/26/2021 9:54 AM

## 2023-01-22 IMAGING — CR DG ANKLE COMPLETE 3+V*R*
3 series · 3 of 3 positions shown · non-contrast
Comparison: None.

CLINICAL DATA: Right ankle pain

EXAM:
RIGHT ANKLE - COMPLETE 3+ VIEW

[ankle ap]
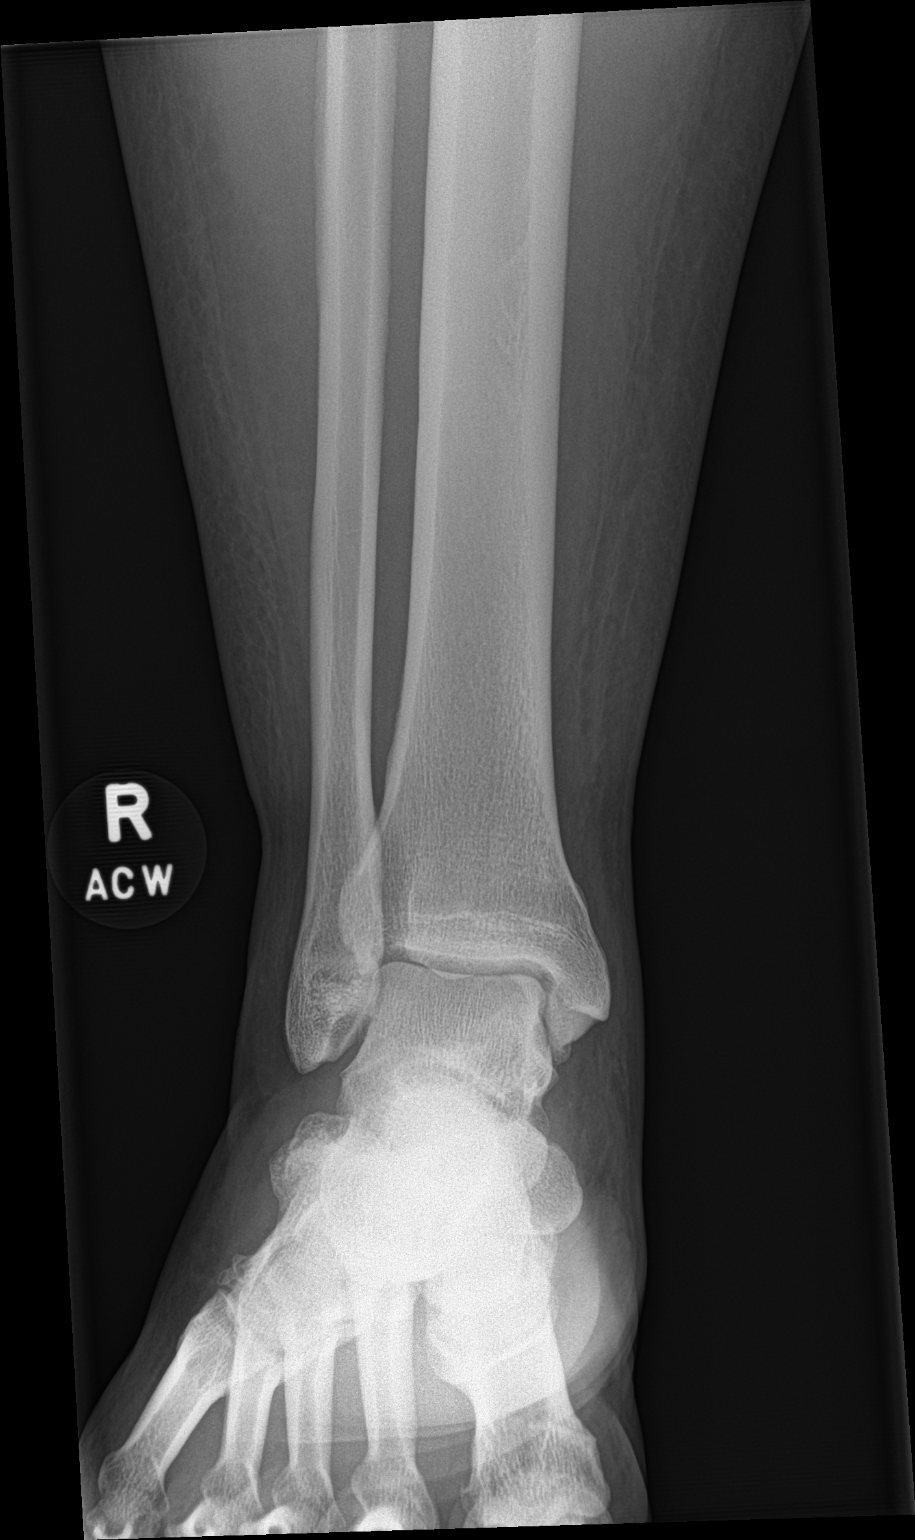

[ankle obl]
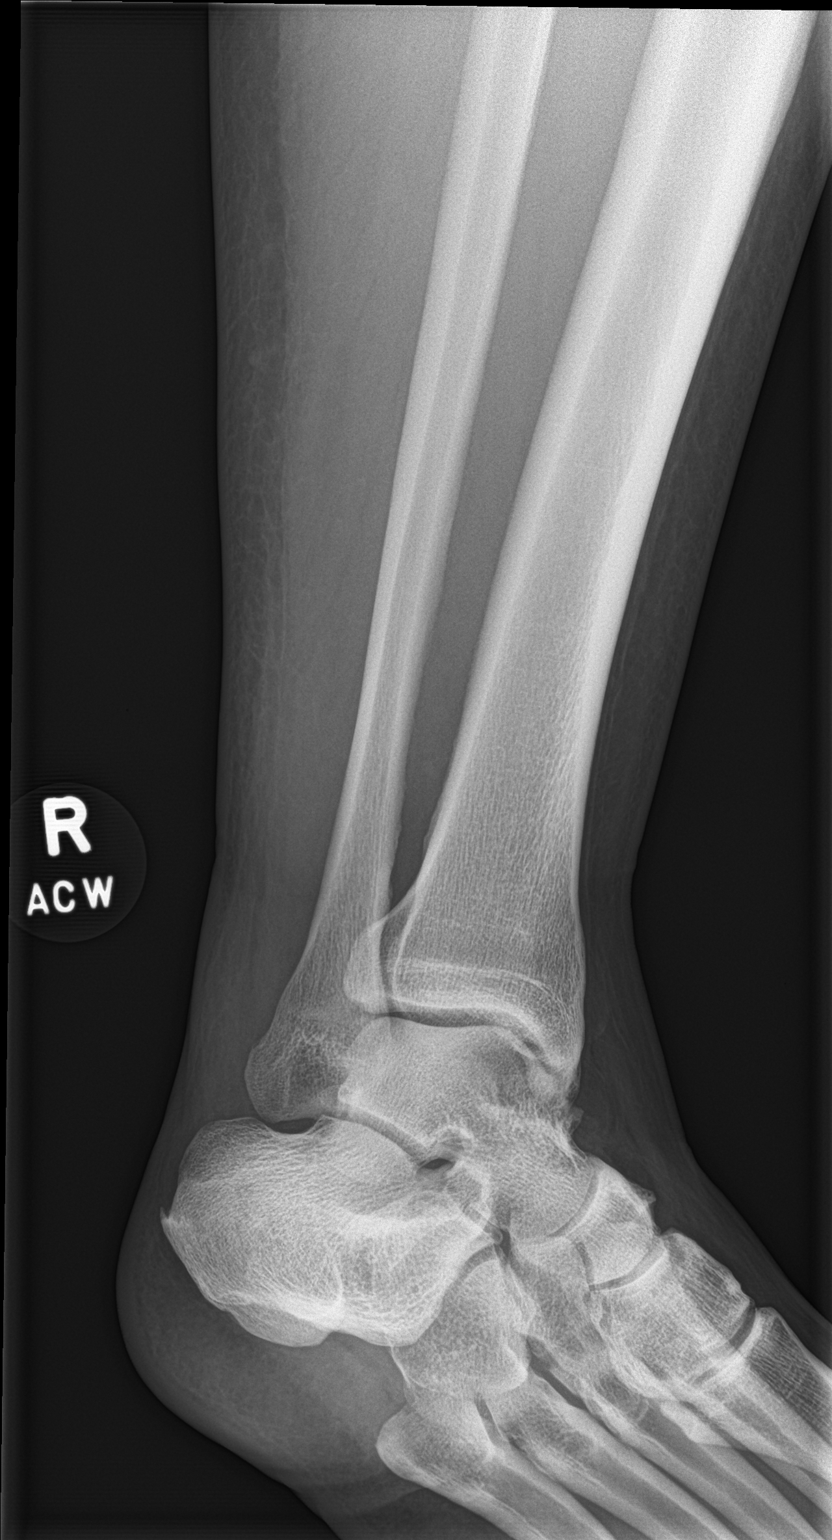

[ankle lat]
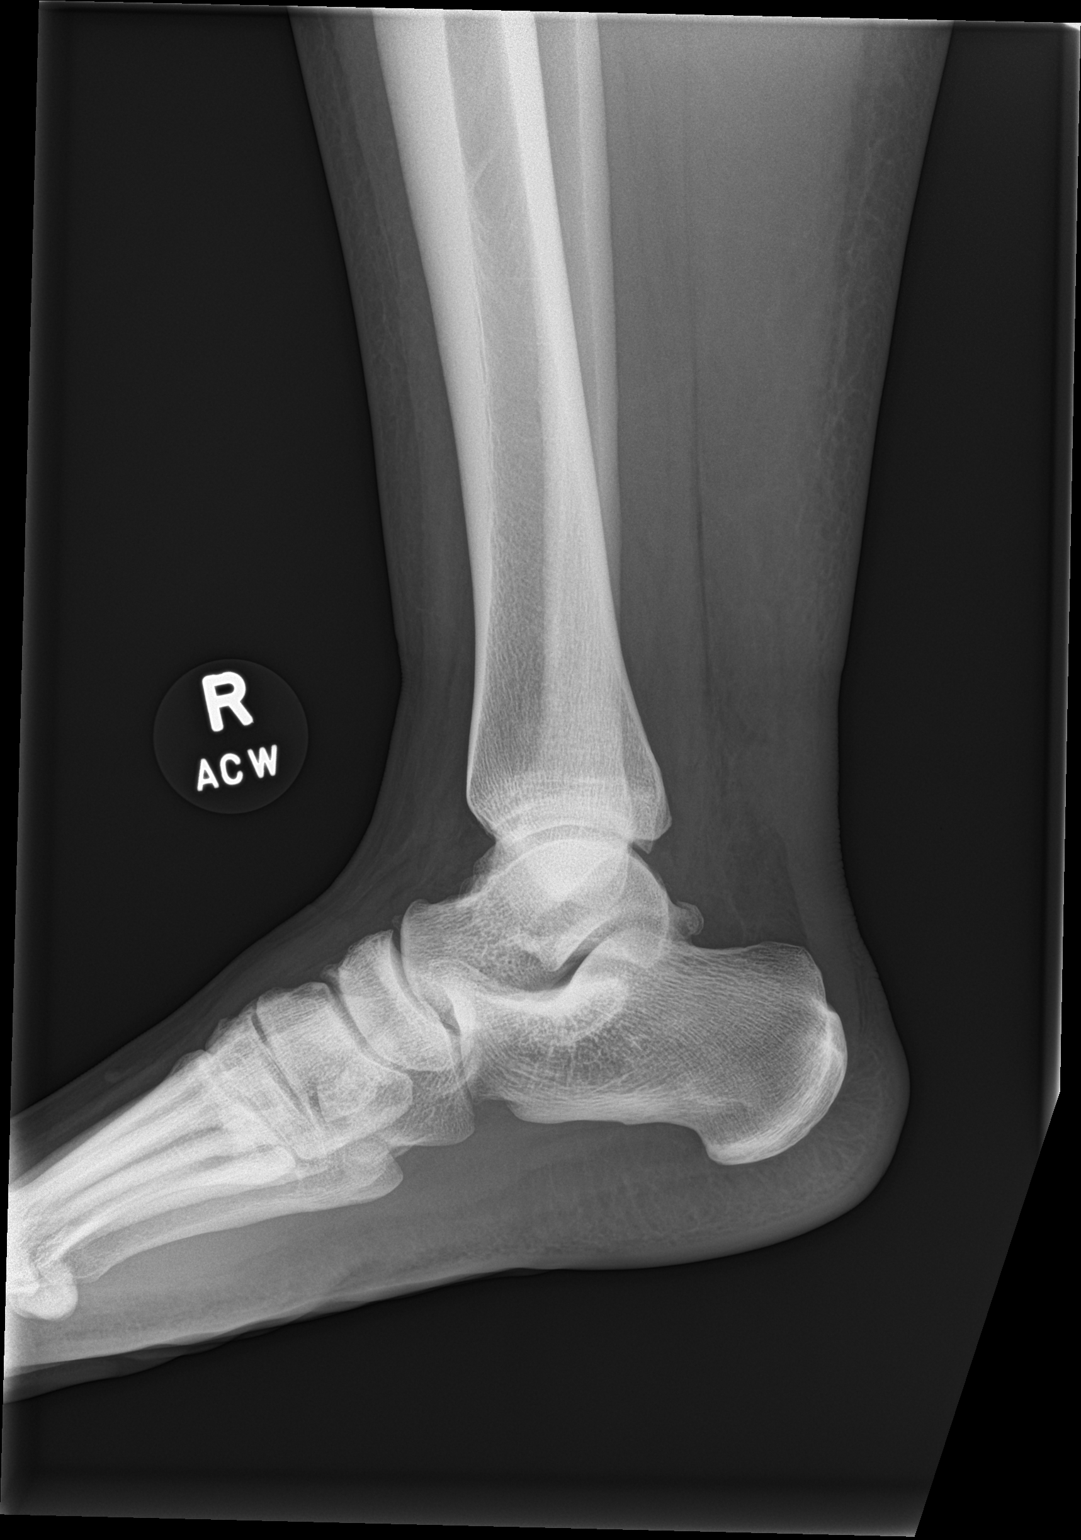

[3 of 3 positions shown; findings below may reference images not displayed]

FINDINGS: There is no evidence of fracture, dislocation, or joint effusion.
There is no evidence of arthropathy or other focal bone abnormality.
Soft tissues are unremarkable.
IMPRESSION: Negative.
